# Patient Record
Sex: Male | Born: 1940 | Race: White | Hispanic: No | Smoking: Former smoker
Health system: Southern US, Community
[De-identification: ages and names within clinical notes are randomized; demographics above are authoritative.]

## PROBLEM LIST (undated history)

## (undated) DIAGNOSIS — R011 Cardiac murmur, unspecified: Secondary | ICD-10-CM

## (undated) DIAGNOSIS — K259 Gastric ulcer, unspecified as acute or chronic, without hemorrhage or perforation: Secondary | ICD-10-CM

## (undated) DIAGNOSIS — E785 Hyperlipidemia, unspecified: Secondary | ICD-10-CM

## (undated) DIAGNOSIS — M1990 Unspecified osteoarthritis, unspecified site: Secondary | ICD-10-CM

## (undated) DIAGNOSIS — I10 Essential (primary) hypertension: Secondary | ICD-10-CM

## (undated) DIAGNOSIS — A4902 Methicillin resistant Staphylococcus aureus infection, unspecified site: Secondary | ICD-10-CM

## (undated) HISTORY — PX: CARPAL TUNNEL RELEASE: SHX101

## (undated) HISTORY — PX: ROTATOR CUFF REPAIR: SHX139

## (undated) HISTORY — DX: Hyperlipidemia, unspecified: E78.5

## (undated) HISTORY — PX: GROIN DEBRIDEMENT: SHX5159

## (undated) HISTORY — DX: Methicillin resistant Staphylococcus aureus infection, unspecified site: A49.02

## (undated) HISTORY — DX: Gastric ulcer, unspecified as acute or chronic, without hemorrhage or perforation: K25.9

## (undated) HISTORY — DX: Unspecified osteoarthritis, unspecified site: M19.90

---

## 2011-09-18 ENCOUNTER — Other Ambulatory Visit: Payer: Self-pay | Admitting: Orthopedic Surgery

## 2011-09-23 ENCOUNTER — Encounter (HOSPITAL_COMMUNITY): Payer: Self-pay | Admitting: Pharmacy Technician

## 2011-10-06 ENCOUNTER — Encounter (HOSPITAL_COMMUNITY): Payer: Self-pay

## 2011-10-06 ENCOUNTER — Encounter (HOSPITAL_COMMUNITY)
Admission: RE | Admit: 2011-10-06 | Discharge: 2011-10-06 | Disposition: A | Discharge status: Home / Self Care | Payer: Medicare Other | Source: Ambulatory Visit | Attending: Orthopedic Surgery | Admitting: Orthopedic Surgery

## 2011-10-06 ENCOUNTER — Inpatient Hospital Stay (HOSPITAL_COMMUNITY): Admission: RE | Admit: 2011-10-06 | Discharge: 2011-10-06 | Payer: Medicare Other | Source: Ambulatory Visit

## 2011-10-06 ENCOUNTER — Ambulatory Visit (HOSPITAL_COMMUNITY)
Admission: RE | Admit: 2011-10-06 | Discharge: 2011-10-06 | Disposition: A | Discharge status: Home / Self Care | Payer: Medicare Other | Source: Ambulatory Visit | Attending: Orthopedic Surgery | Admitting: Orthopedic Surgery

## 2011-10-06 DIAGNOSIS — Z01818 Encounter for other preprocedural examination: Secondary | ICD-10-CM | POA: Insufficient documentation

## 2011-10-06 DIAGNOSIS — Z01812 Encounter for preprocedural laboratory examination: Secondary | ICD-10-CM | POA: Insufficient documentation

## 2011-10-06 HISTORY — DX: Cardiac murmur, unspecified: R01.1

## 2011-10-06 HISTORY — DX: Essential (primary) hypertension: I10

## 2011-10-06 LAB — COMPREHENSIVE METABOLIC PANEL
ALT: 28 U/L (ref 0–53)
AST: 33 U/L (ref 0–37)
Alkaline Phosphatase: 58 U/L (ref 39–117)
CO2: 24 mEq/L (ref 19–32)
Chloride: 97 mEq/L (ref 96–112)
GFR calc Af Amer: >90 mL/min (ref >90)
GFR calc non Af Amer: 82 mL/min — ABNORMAL LOW (ref >90)
Glucose, Bld: 87 mg/dL (ref 70–99)
Potassium: 4.3 mEq/L (ref 3.5–5.1)
Sodium: 133 mEq/L — ABNORMAL LOW (ref 135–145)

## 2011-10-06 LAB — TYPE AND SCREEN

## 2011-10-06 LAB — URINALYSIS, ROUTINE W REFLEX MICROSCOPIC
Bilirubin Urine: NEGATIVE
Hgb urine dipstick: NEGATIVE
Nitrite: NEGATIVE
Specific Gravity, Urine: 1.012 (ref 1.005–1.030)
Urobilinogen, UA: 0.2 mg/dL (ref 0.0–1.0)
pH: 6.5 (ref 5.0–8.0)

## 2011-10-06 LAB — DIFFERENTIAL
Basophils Absolute: 0 10*3/uL (ref 0.0–0.1)
Eosinophils Absolute: 0.2 10*3/uL (ref 0.0–0.7)
Eosinophils Relative: 2 % (ref 0–5)
Monocytes Absolute: 0.8 10*3/uL (ref 0.1–1.0)

## 2011-10-06 LAB — CBC
Hemoglobin: 15.3 g/dL (ref 13.0–17.0)
RBC: 4.82 MIL/uL (ref 4.22–5.81)
WBC: 9.1 10*3/uL (ref 4.0–10.5)

## 2011-10-06 LAB — APTT: aPTT: 29 seconds (ref 24–37)

## 2011-10-06 LAB — SURGICAL PCR SCREEN
MRSA, PCR: NEGATIVE
Staphylococcus aureus: NEGATIVE

## 2011-10-06 LAB — PROTIME-INR: Prothrombin Time: 13 seconds (ref 11.6–15.2)

## 2011-10-06 LAB — ABO/RH: ABO/RH(D): AB POS

## 2011-10-06 NOTE — Pre-Procedure Instructions (Signed)
20 COULTER OLDAKER  10/06/2011  Your procedure is scheduled on:  10/08/11  Report to [facility] at 8:50 AM.  Call this number if you have problems the morning of surgery: [phone]   Remember: Bring Incentive Spirometer back the day of surgery and leave in suitcase.   Do not eat food:After Midnight.  May have clear liquids: up to 4 Hours before arrival (4:50 AM).  Clear liquids include soda, tea, black coffee, apple or grape juice, broth.  Take these medicines the morning of surgery with A SIP OF WATER: None   Do not wear jewelry, make-up or nail polish.  Do not wear lotions, powders, or perfumes. You may wear deodorant.  Do not shave 48 hours prior to surgery. Men may shave face and neck.  Do not bring valuables to the hospital.  Contacts, dentures or bridgework may not be worn into surgery.  Leave suitcase in the car. After surgery it may be brought to your room.  For patients admitted to the hospital, checkout time is 11:00 AM the day of discharge.   Patients discharged the day of surgery will not be allowed to drive home.    Special Instructions: CHG Shower Use Special Wash: 1/2 bottle night before surgery and 1/2 bottle morning of surgery.   Please read over the following fact sheets that you were given: Pain Booklet, Coughing and Deep Breathing, Blood Transfusion Information, MRSA Information and Surgical Site Infection Prevention

## 2011-10-06 NOTE — Progress Notes (Signed)
Gave note to [facility] to request EKG from Dr. Wende Mott office patient states was done 2 wks ago. Also requesting most recent office note, tests, EKG from [facility]. Patient is having ECHO done 5/20 @ 2:00.

## 2011-10-06 NOTE — Pre-Procedure Instructions (Signed)
20 Alan Munoz  10/06/2011   Your procedure is scheduled on:  10/08/11  Report to [facility] at 8:50  AM.  Call this number if you have problems the morning of surgery: [phone]   Remember: Bring Incentive Spirometer back in suitcase day of surgery.   Do not eat food:After Midnight.  May have clear liquids: up to 4 Hours before arrival (4:50 AM).  Clear liquids include soda, tea, black coffee, apple or grape juice, broth.  Take these medicines the morning of surgery with A SIP OF WATER: None   Do not wear jewelry, make-up or nail polish.  Do not wear lotions, powders, or perfumes. You may wear deodorant.  Do not shave 48 hours prior to surgery. Men may shave face and neck.  Do not bring valuables to the hospital.  Contacts, dentures or bridgework may not be worn into surgery.  Leave suitcase in the car. After surgery it may be brought to your room.  For patients admitted to the hospital, checkout time is 11:00 AM the day of discharge.   Patients discharged the day of surgery will not be allowed to drive home.    Special Instructions: CHG Shower Use Special Wash: 1/2 bottle night before surgery and 1/2 bottle morning of surgery.   Please read over the following fact sheets that you were given: Pain Booklet, Coughing and Deep Breathing, Blood Transfusion Information, MRSA Information and Surgical Site Infection Prevention

## 2011-10-07 MED ORDER — CEFAZOLIN SODIUM-DEXTROSE 2-3 GM-% IV SOLR
2.0000 g | INTRAVENOUS | Status: AC
  Administered 2011-10-08: 1 g via INTRAVENOUS
  Administered 2011-10-08: 2 g via INTRAVENOUS
  Filled 2011-10-07: qty 50

## 2011-10-07 NOTE — Consult Note (Signed)
Anesthesia Chart Review:  Patient is a 71 year old male scheduled for ACDF C4-7 on 10/08/11.  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  He receives primary care at [facility].  He was recently evaluated by Cardiologist Dr. Wille Glaser at [facility] on 10/02/11 for abnormal holter monitor and pre-operative evaluation.  Holter on 09/23/11 showed occasional PVCs, frequent PACs and no significant bradycardia or pauses.  EKG from [facility] on 09/19/11 showed SR, first degree AVB, LAD, incomplete right BBB, LAFB.     Echo was done on 10/06/11 at [facility] and showed mild LVH, normal LV systolic function with no appreciable segmental abnormalities, mild to moderate AR, mild TR/PR.  Ultimately he was felt low cardiac risk for the planned cervical procedure, although he is at some risk for post-operative afib.  Repeat echo in 2-3 years was recommended either by Cardiology or his PCP.  Labs noted.  CXR from 10/06/11 showed no acute cardiopulmonary process.   Plan to proceed.  Shonna Chock, PA-C

## 2011-10-07 NOTE — Progress Notes (Signed)
Chart to Shonna Chock PA to review EKG, cardiac notes

## 2011-10-07 NOTE — Progress Notes (Signed)
[facility](#[phone], [phone]) called to request for recent EKG, ECHO, and recent Office Visit notes. Requested to fax w/ letterhead.  Faxed.//L. Ellenora Talton,RN

## 2011-10-08 ENCOUNTER — Inpatient Hospital Stay (HOSPITAL_COMMUNITY)
Admission: RE | Admit: 2011-10-08 | Discharge: 2011-10-09 | DRG: 473 | Disposition: A | Discharge status: Home / Self Care | Payer: Medicare Other | Source: Ambulatory Visit | Attending: Orthopedic Surgery | Admitting: Orthopedic Surgery

## 2011-10-08 ENCOUNTER — Inpatient Hospital Stay (HOSPITAL_COMMUNITY): Payer: Medicare Other

## 2011-10-08 ENCOUNTER — Inpatient Hospital Stay (HOSPITAL_COMMUNITY): Payer: Medicare Other | Admitting: Vascular Surgery

## 2011-10-08 ENCOUNTER — Encounter (HOSPITAL_COMMUNITY): Payer: Self-pay | Admitting: Vascular Surgery

## 2011-10-08 ENCOUNTER — Encounter (HOSPITAL_COMMUNITY)
Admission: RE | Disposition: A | Discharge status: Home / Self Care | Payer: Self-pay | Source: Ambulatory Visit | Attending: Orthopedic Surgery

## 2011-10-08 ENCOUNTER — Encounter (HOSPITAL_COMMUNITY): Payer: Self-pay | Admitting: Surgery

## 2011-10-08 DIAGNOSIS — Z87891 Personal history of nicotine dependence: Secondary | ICD-10-CM

## 2011-10-08 DIAGNOSIS — Z888 Allergy status to other drugs, medicaments and biological substances status: Secondary | ICD-10-CM

## 2011-10-08 DIAGNOSIS — Z8614 Personal history of Methicillin resistant Staphylococcus aureus infection: Secondary | ICD-10-CM

## 2011-10-08 DIAGNOSIS — I10 Essential (primary) hypertension: Secondary | ICD-10-CM | POA: Diagnosis present

## 2011-10-08 DIAGNOSIS — M5000 Cervical disc disorder with myelopathy, unspecified cervical region: Principal | ICD-10-CM | POA: Diagnosis present

## 2011-10-08 HISTORY — PX: ANTERIOR CERVICAL DECOMP/DISCECTOMY FUSION: SHX1161

## 2011-10-08 SURGERY — ANTERIOR CERVICAL DECOMPRESSION/DISCECTOMY FUSION 3 LEVELS
Anesthesia: General | Site: Spine Cervical | Laterality: Bilateral | Wound class: Clean

## 2011-10-08 MED ORDER — ROCURONIUM BROMIDE 100 MG/10ML IV SOLN
INTRAVENOUS | Status: DC | PRN
  Administered 2011-10-08: 50 mg via INTRAVENOUS

## 2011-10-08 MED ORDER — PROPOFOL 10 MG/ML IV EMUL
INTRAVENOUS | Status: DC | PRN
  Administered 2011-10-08: 200 mg via INTRAVENOUS

## 2011-10-08 MED ORDER — VITAMIN E 180 MG (400 UNIT) PO CAPS
400.0000 [IU] | ORAL_CAPSULE | Freq: Two times a day (BID) | ORAL | Status: DC
  Administered 2011-10-08 – 2011-10-09 (×2): 400 [IU] via ORAL
  Filled 2011-10-08 (×3): qty 1

## 2011-10-08 MED ORDER — DOCUSATE SODIUM 100 MG PO CAPS
100.0000 mg | ORAL_CAPSULE | Freq: Two times a day (BID) | ORAL | Status: DC
  Administered 2011-10-08 – 2011-10-09 (×2): 100 mg via ORAL
  Filled 2011-10-08 (×2): qty 1

## 2011-10-08 MED ORDER — LACTATED RINGERS IV SOLN
INTRAVENOUS | Status: DC
  Administered 2011-10-08: 50 mL/h via INTRAVENOUS

## 2011-10-08 MED ORDER — PHENOL 1.4 % MT LIQD
1.0000 | OROMUCOSAL | Status: DC | PRN
  Filled 2011-10-08: qty 177

## 2011-10-08 MED ORDER — OXYCODONE-ACETAMINOPHEN 5-325 MG PO TABS
1.0000 | ORAL_TABLET | ORAL | Status: DC | PRN
  Administered 2011-10-08 – 2011-10-09 (×4): 2 via ORAL
  Filled 2011-10-08 (×4): qty 2

## 2011-10-08 MED ORDER — ALUM & MAG HYDROXIDE-SIMETH 200-200-20 MG/5ML PO SUSP: 30.0000 mL | Freq: Four times a day (QID) | ORAL | Status: DC | PRN

## 2011-10-08 MED ORDER — POVIDONE-IODINE 7.5 % EX SOLN
Freq: Once | CUTANEOUS | Status: DC
  Filled 2011-10-08: qty 118

## 2011-10-08 MED ORDER — ACETAMINOPHEN 650 MG RE SUPP: 650.0000 mg | RECTAL | Status: DC | PRN

## 2011-10-08 MED ORDER — HYDROCHLOROTHIAZIDE 25 MG PO TABS
25.0000 mg | ORAL_TABLET | Freq: Every day | ORAL | Status: DC
  Administered 2011-10-09: 25 mg via ORAL
  Filled 2011-10-08 (×2): qty 1

## 2011-10-08 MED ORDER — POTASSIUM CHLORIDE IN NACL 20-0.9 MEQ/L-% IV SOLN
INTRAVENOUS | Status: DC
  Administered 2011-10-08: 18:00:00 via INTRAVENOUS
  Filled 2011-10-08 (×4): qty 1000

## 2011-10-08 MED ORDER — HYDROMORPHONE HCL PF 1 MG/ML IJ SOLN
0.2500 mg | INTRAMUSCULAR | Status: DC | PRN
  Administered 2011-10-08 (×3): 0.5 mg via INTRAVENOUS

## 2011-10-08 MED ORDER — SIMVASTATIN 20 MG PO TABS
20.0000 mg | ORAL_TABLET | Freq: Every day | ORAL | Status: DC
  Filled 2011-10-08 (×2): qty 1

## 2011-10-08 MED ORDER — BUPIVACAINE-EPINEPHRINE 0.25% -1:200000 IJ SOLN
INTRAMUSCULAR | Status: DC | PRN
  Administered 2011-10-08: 2 mL

## 2011-10-08 MED ORDER — EPHEDRINE SULFATE 50 MG/ML IJ SOLN
INTRAMUSCULAR | Status: DC | PRN
  Administered 2011-10-08: 10 mg via INTRAVENOUS

## 2011-10-08 MED ORDER — ZOLPIDEM TARTRATE 5 MG PO TABS: 5.0000 mg | ORAL_TABLET | Freq: Every evening | ORAL | Status: DC | PRN

## 2011-10-08 MED ORDER — MENTHOL 3 MG MT LOZG
1.0000 | LOZENGE | OROMUCOSAL | Status: DC | PRN
  Administered 2011-10-09: 3 mg via ORAL
  Filled 2011-10-08 (×2): qty 9

## 2011-10-08 MED ORDER — MORPHINE SULFATE 2 MG/ML IJ SOLN
2.0000 mg | INTRAMUSCULAR | Status: DC | PRN
  Administered 2011-10-09: 2 mg via INTRAVENOUS
  Filled 2011-10-08: qty 1

## 2011-10-08 MED ORDER — SODIUM CHLORIDE 0.9 % IV SOLN
INTRAVENOUS | Status: DC | PRN
  Administered 2011-10-08: 16:00:00 via INTRAVENOUS

## 2011-10-08 MED ORDER — LIDOCAINE HCL (CARDIAC) 20 MG/ML IV SOLN
INTRAVENOUS | Status: DC | PRN
  Administered 2011-10-08: 80 mg via INTRAVENOUS

## 2011-10-08 MED ORDER — NIACIN ER 500 MG PO TBCR
500.0000 mg | EXTENDED_RELEASE_TABLET | Freq: Every day | ORAL | Status: DC
  Administered 2011-10-08: 500 mg via ORAL
  Filled 2011-10-08 (×2): qty 1

## 2011-10-08 MED ORDER — LOSARTAN POTASSIUM-HCTZ 100-25 MG PO TABS: 1.0000 | ORAL_TABLET | Freq: Every day | ORAL | Status: DC

## 2011-10-08 MED ORDER — CEFAZOLIN SODIUM 1-5 GM-% IV SOLN
INTRAVENOUS | Status: AC
  Filled 2011-10-08: qty 50

## 2011-10-08 MED ORDER — CALCIUM CARBONATE 600 MG PO TABS: 600.0000 mg | ORAL_TABLET | Freq: Two times a day (BID) | ORAL | Status: DC

## 2011-10-08 MED ORDER — VITAMIN B-6 100 MG PO TABS
100.0000 mg | ORAL_TABLET | Freq: Two times a day (BID) | ORAL | Status: DC
  Administered 2011-10-08 – 2011-10-09 (×2): 100 mg via ORAL
  Filled 2011-10-08 (×3): qty 1

## 2011-10-08 MED ORDER — VITAMIN D3 25 MCG (1000 UNIT) PO TABS
1000.0000 [IU] | ORAL_TABLET | Freq: Every day | ORAL | Status: DC
  Administered 2011-10-09: 1000 [IU] via ORAL
  Filled 2011-10-08: qty 1

## 2011-10-08 MED ORDER — FOLIC ACID 1 MG PO TABS
1.0000 mg | ORAL_TABLET | Freq: Every day | ORAL | Status: DC
  Administered 2011-10-09: 1 mg via ORAL
  Filled 2011-10-08: qty 1

## 2011-10-08 MED ORDER — SODIUM CHLORIDE 0.9 % IJ SOLN: 3.0000 mL | INTRAMUSCULAR | Status: DC | PRN

## 2011-10-08 MED ORDER — CO Q 10 10 MG PO CAPS: 1.0000 | ORAL_CAPSULE | Freq: Every day | ORAL | Status: DC

## 2011-10-08 MED ORDER — FENTANYL CITRATE 0.05 MG/ML IJ SOLN
INTRAMUSCULAR | Status: DC | PRN
  Administered 2011-10-08 (×5): 50 ug via INTRAVENOUS
  Administered 2011-10-08: 100 ug via INTRAVENOUS
  Administered 2011-10-08 (×3): 50 ug via INTRAVENOUS

## 2011-10-08 MED ORDER — ONDANSETRON HCL 4 MG/2ML IJ SOLN: 4.0000 mg | Freq: Four times a day (QID) | INTRAMUSCULAR | Status: DC | PRN

## 2011-10-08 MED ORDER — ADULT MULTIVITAMIN W/MINERALS CH
1.0000 | ORAL_TABLET | Freq: Every day | ORAL | Status: DC
  Administered 2011-10-09: 1 via ORAL
  Filled 2011-10-08: qty 1

## 2011-10-08 MED ORDER — VITAMIN C 500 MG PO TABS
1000.0000 mg | ORAL_TABLET | Freq: Every day | ORAL | Status: DC
  Administered 2011-10-09: 1000 mg via ORAL
  Filled 2011-10-08: qty 2

## 2011-10-08 MED ORDER — INOSITOL NIACINATE 500 MG PO CAPS: 1.0000 | ORAL_CAPSULE | Freq: Every day | ORAL | Status: DC

## 2011-10-08 MED ORDER — 0.9 % SODIUM CHLORIDE (POUR BTL) OPTIME
TOPICAL | Status: DC | PRN
  Administered 2011-10-08: 1000 mL

## 2011-10-08 MED ORDER — SENNA 8.6 MG PO TABS
1.0000 | ORAL_TABLET | Freq: Two times a day (BID) | ORAL | Status: DC
  Administered 2011-10-09: 8.6 mg via ORAL
  Filled 2011-10-08 (×2): qty 1

## 2011-10-08 MED ORDER — ACETAMINOPHEN 325 MG PO TABS: 650.0000 mg | ORAL_TABLET | ORAL | Status: DC | PRN

## 2011-10-08 MED ORDER — DIAZEPAM 5 MG PO TABS
5.0000 mg | ORAL_TABLET | Freq: Four times a day (QID) | ORAL | Status: DC | PRN
  Administered 2011-10-08 – 2011-10-09 (×3): 5 mg via ORAL
  Filled 2011-10-08 (×3): qty 1

## 2011-10-08 MED ORDER — SODIUM CHLORIDE 0.9 % IJ SOLN
3.0000 mL | Freq: Two times a day (BID) | INTRAMUSCULAR | Status: DC
  Administered 2011-10-08: 3 mL via INTRAVENOUS

## 2011-10-08 MED ORDER — THROMBIN 20000 UNITS EX KIT
PACK | CUTANEOUS | Status: DC | PRN
  Administered 2011-10-08: 12:00:00 via TOPICAL

## 2011-10-08 MED ORDER — LOSARTAN POTASSIUM 50 MG PO TABS
100.0000 mg | ORAL_TABLET | Freq: Every day | ORAL | Status: DC
  Administered 2011-10-08 – 2011-10-09 (×2): 100 mg via ORAL
  Filled 2011-10-08 (×2): qty 2

## 2011-10-08 MED ORDER — FOLIC ACID 800 MCG PO TABS: 400.0000 ug | ORAL_TABLET | Freq: Two times a day (BID) | ORAL | Status: DC

## 2011-10-08 MED ORDER — ONDANSETRON HCL 4 MG/2ML IJ SOLN: 4.0000 mg | INTRAMUSCULAR | Status: DC | PRN

## 2011-10-08 MED ORDER — CALCIUM CARBONATE 1250 (500 CA) MG PO TABS
500.0000 mg | ORAL_TABLET | Freq: Two times a day (BID) | ORAL | Status: DC
  Administered 2011-10-09: 500 mg via ORAL
  Filled 2011-10-08 (×3): qty 1

## 2011-10-08 MED ORDER — CEFAZOLIN SODIUM 1-5 GM-% IV SOLN
1.0000 g | Freq: Three times a day (TID) | INTRAVENOUS | Status: AC
  Administered 2011-10-08 – 2011-10-09 (×2): 1 g via INTRAVENOUS
  Filled 2011-10-08 (×2): qty 50

## 2011-10-08 MED ORDER — HYDROMORPHONE HCL PF 1 MG/ML IJ SOLN
INTRAMUSCULAR | Status: AC
  Filled 2011-10-08: qty 1

## 2011-10-08 MED ORDER — MIDAZOLAM HCL 5 MG/5ML IJ SOLN
INTRAMUSCULAR | Status: DC | PRN
  Administered 2011-10-08: 2 mg via INTRAVENOUS

## 2011-10-08 MED ORDER — LACTATED RINGERS IV SOLN
INTRAVENOUS | Status: DC | PRN
  Administered 2011-10-08 (×3): via INTRAVENOUS

## 2011-10-08 SURGICAL SUPPLY — 80 items
BENZOIN TINCTURE PRP APPL 2/3 (GAUZE/BANDAGES/DRESSINGS) ×2 IMPLANT
BIT DRILL NEURO 2X3.1 SFT TUCH (MISCELLANEOUS) ×1 IMPLANT
BLADE LONG MED 31X9 (MISCELLANEOUS) IMPLANT
BLADE SURG 15 STRL LF DISP TIS (BLADE) ×1 IMPLANT
BLADE SURG 15 STRL SS (BLADE) ×1
BLADE SURG ROTATE 9660 (MISCELLANEOUS) IMPLANT
BUR MATCHSTICK NEURO 3.0 LAGG (BURR) ×2 IMPLANT
CARTRIDGE OIL MAESTRO DRILL (MISCELLANEOUS) ×1 IMPLANT
CLOTH BEACON ORANGE TIMEOUT ST (SAFETY) ×2 IMPLANT
COLLAR CERV LO CONTOUR FIRM DE (SOFTGOODS) IMPLANT
CORDS BIPOLAR (ELECTRODE) ×2 IMPLANT
COVER SURGICAL LIGHT HANDLE (MISCELLANEOUS) ×2 IMPLANT
CRADLE DONUT ADULT HEAD (MISCELLANEOUS) ×2 IMPLANT
DEVICE ENDSKLTN IMPLNT MED 6MM (Orthopedic Implant) ×1 IMPLANT
DEVICE ENDSKLTN MED 6 7MM (Orthopedic Implant) ×1 IMPLANT
DIFFUSER DRILL AIR PNEUMATIC (MISCELLANEOUS) ×2 IMPLANT
DRAIN JACKSON RD 7FR 3/32 (WOUND CARE) IMPLANT
DRAPE C-ARM 42X72 X-RAY (DRAPES) ×2 IMPLANT
DRAPE POUCH INSTRU U-SHP 10X18 (DRAPES) ×2 IMPLANT
DRAPE SURG 17X23 STRL (DRAPES) ×6 IMPLANT
DRILL NEURO 2X3.1 SOFT TOUCH (MISCELLANEOUS) ×2
DURAPREP 26ML APPLICATOR (WOUND CARE) ×2 IMPLANT
ELECT COATED BLADE 2.86 ST (ELECTRODE) ×2 IMPLANT
ELECT REM PT RETURN 9FT ADLT (ELECTROSURGICAL) ×2
ELECTRODE REM PT RTRN 9FT ADLT (ELECTROSURGICAL) ×1 IMPLANT
ENDOSKELETON IMPLANT MED 6MM (Orthopedic Implant) ×2 IMPLANT
ENDOSKELETON MED 6 7MM (Orthopedic Implant) ×2 IMPLANT
EVACUATOR SILICONE 100CC (DRAIN) IMPLANT
GAUZE SPONGE 4X4 16PLY XRAY LF (GAUZE/BANDAGES/DRESSINGS) ×2 IMPLANT
GLOVE BIO SURGEON STRL SZ8 (GLOVE) ×4 IMPLANT
GLOVE BIOGEL M 7.0 STRL (GLOVE) ×2 IMPLANT
GLOVE BIOGEL PI IND STRL 7.0 (GLOVE) ×1 IMPLANT
GLOVE BIOGEL PI IND STRL 7.5 (GLOVE) ×2 IMPLANT
GLOVE BIOGEL PI IND STRL 8 (GLOVE) ×1 IMPLANT
GLOVE BIOGEL PI INDICATOR 7.0 (GLOVE) ×1
GLOVE BIOGEL PI INDICATOR 7.5 (GLOVE) ×2
GLOVE BIOGEL PI INDICATOR 8 (GLOVE) ×1
GLOVE SURG SS PI 6.5 STRL IVOR (GLOVE) ×2 IMPLANT
GLOVE SURG SS PI 7.5 STRL IVOR (GLOVE) ×2 IMPLANT
GOWN STRL NON-REIN LRG LVL3 (GOWN DISPOSABLE) IMPLANT
GOWN STRL REIN XL XLG (GOWN DISPOSABLE) ×4 IMPLANT
INTERLOCK LRDTC CRVCL VBR 8MM (Peek) ×1 IMPLANT
IV CATH 14GX2 1/4 (CATHETERS) ×2 IMPLANT
KIT BASIN OR (CUSTOM PROCEDURE TRAY) ×2 IMPLANT
KIT ROOM TURNOVER OR (KITS) ×2 IMPLANT
LORDOTIC CERVICAL VBR 8MM SM (Peek) ×2 IMPLANT
MANIFOLD NEPTUNE II (INSTRUMENTS) ×2 IMPLANT
MARKER SKIN DUAL TIP RULER LAB (MISCELLANEOUS) ×2 IMPLANT
NEEDLE 27GAX1X1/2 (NEEDLE) ×2 IMPLANT
NEEDLE SPNL 20GX3.5 QUINCKE YW (NEEDLE) ×2 IMPLANT
NS IRRIG 1000ML POUR BTL (IV SOLUTION) ×2 IMPLANT
OIL CARTRIDGE MAESTRO DRILL (MISCELLANEOUS) ×2
PACK ORTHO CERVICAL (CUSTOM PROCEDURE TRAY) ×2 IMPLANT
PAD ARMBOARD 7.5X6 YLW CONV (MISCELLANEOUS) ×4 IMPLANT
PATTIES SURGICAL .5 X.5 (GAUZE/BANDAGES/DRESSINGS) IMPLANT
PATTIES SURGICAL .5 X1 (DISPOSABLE) ×2 IMPLANT
PIN DISTRACTION 14 (PIN) ×4 IMPLANT
PIN DISTRACTION 14MM (PIN) IMPLANT
PLATE 51 BND GRV PREBNT 60X16 (Plate) ×1 IMPLANT
PLATE 51MM (Plate) ×1 IMPLANT
PUTTY BONE DBX 5CC MIX (Putty) ×2 IMPLANT
SCREW 4.0X16MM (Screw) ×16 IMPLANT
SPONGE GAUZE 4X4 12PLY (GAUZE/BANDAGES/DRESSINGS) ×2 IMPLANT
SPONGE INTESTINAL PEANUT (DISPOSABLE) ×2 IMPLANT
SPONGE SURGIFOAM ABS GEL 100 (HEMOSTASIS) ×2 IMPLANT
STRIP CLOSURE SKIN 1/2X4 (GAUZE/BANDAGES/DRESSINGS) ×2 IMPLANT
SURGIFLO TRUKIT (HEMOSTASIS) IMPLANT
SUT MNCRL AB 4-0 PS2 18 (SUTURE) ×2 IMPLANT
SUT SILK 4 0 (SUTURE) ×1
SUT SILK 4-0 18XBRD TIE 12 (SUTURE) ×1 IMPLANT
SUT VIC AB 2-0 CT2 18 VCP726D (SUTURE) ×2 IMPLANT
SYR BULB IRRIGATION 50ML (SYRINGE) ×2 IMPLANT
SYR CONTROL 10ML LL (SYRINGE) ×2 IMPLANT
TAPE CLOTH 4X10 WHT NS (GAUZE/BANDAGES/DRESSINGS) IMPLANT
TAPE UMBILICAL COTTON 1/8X30 (MISCELLANEOUS) ×4 IMPLANT
TOWEL OR 17X24 6PK STRL BLUE (TOWEL DISPOSABLE) ×2 IMPLANT
TOWEL OR 17X26 10 PK STRL BLUE (TOWEL DISPOSABLE) ×2 IMPLANT
TRAY FOLEY CATH 14FR (SET/KITS/TRAYS/PACK) ×2 IMPLANT
WATER STERILE IRR 1000ML POUR (IV SOLUTION) ×2 IMPLANT
YANKAUER SUCT BULB TIP NO VENT (SUCTIONS) ×2 IMPLANT

## 2011-10-08 NOTE — Op Note (Signed)
NAMEJHAIR, Alan Munoz                ACCOUNT NO.:  [id number]  MEDICAL RECORD NO.:  [id number]  LOCATION:  3528                         FACILITY:  [facility]  PHYSICIAN:  Estill Bamberg, MD      DATE OF BIRTH:  1940/07/16  DATE OF PROCEDURE:  10/08/2011 DATE OF DISCHARGE:                              OPERATIVE REPORT   PREOPERATIVE DIAGNOSIS:  Cervical myeloradiculopathy.  POSTOPERATIVE DIAGNOSIS:  Cervical myeloradiculopathy.  PROCEDURE: 1. C4-5, C5-6, C6-7 anterior cervical decompression and fusion. 2. Placement of anterior instrumentation, C4-C7. 3. Insertion of interbody device x3 (Titian medium interbody cages). 4. Use of local autograft. 5. Use of morselized allograft. 6. Intraoperative use of fluoroscopy.  SURGEON:  Estill Bamberg, MD.  ASSISTANT:  Skip Mayer, PA-C.  ANESTHESIA:  General endotracheal anesthesia.  COMPLICATIONS:  None.  DISPOSITION:  Stable.  ESTIMATED BLOOD LOSS:  Minimal.  INDICATIONS FOR PROCEDURE:  Briefly, Alan Munoz is a very pleasant 71- year-old male who initially presented to me with numbness in his bilateral hands as well as tingling in his hands.  Of note, the patient did see me for a second opinion.  He did previously see a Careers adviser who did recommend a three-level anterior cervical decompression and fusion. Upon my evaluation with the patient, he did report deterioration in his balance and deterioration in his fine motor skills.  He did also have numbness in his bilateral hands.  He did have a positive Hoffman sign bilaterally.  I did review an MRI, which was notable for prominent compression of the spinal cord at the C4-5 level and  neural foraminal stenosis at C5-6 and C6-7.  Given the spinal cord compression and patient's ongoing symptoms and exam findings associated with myelopathy, we did have a discussion regarding going forward with a three-level anterior cervical decompression and fusion.  The patient did fully understand the risks  and limitations of the procedure as outlined in my preoperative note.  OPERATIVE COURSE:  On Oct 08, 2011, the patient was brought to surgery and general endotracheal anesthesia was administered.  The patient was placed supine on a well-padded hospital bed.  The neck was placed in a gentle degree of extension.  The patient's arms were secured to the sides.  All bony prominences were meticulously padded.  The area of the ulnar nerve was meticulously padded.  SCDs were placed and antibiotics were given.  I then prepped and draped the neck in the usual sterile fashion.  I then made a transverse incision from the midline to the medial border of the sternocleidomastoid muscle overlying the C5-6 interspace.  The platysma was sharply incised.  The plane between the sternocleidomastoid muscle laterally and strap muscles medially was readily identified and explored.  The carotid artery was noted and was retracted laterally.  The anterior cervical spine was readily identified.  I did place a spinal needle into the C4-5 interspace and a lateral fluoroscopic view did confirm this to be the appropriate operative level.  Of note, there were very prominent osteophytes noted at the C5-6 and C6-7 interspaces and these were taken down using a rongeur and placed on the back table.  There was also noted to be anterolisthesis across  the C4-5 interspace.  This was noted in the patient's preoperative radiographs.  I then turned my attention towards the C6-7 interspace.  I then placed a Caspar pin into the C6 and C7 vertebral bodies and gentle distraction was applied across the C6-7 interspace.  Of note, this interspace was extremely tight and very much difficult to access.  I did however meticulously use a series of curettes and Kerrison punches to gain access into the C6-7 intervertebral space.  I did gain access to the posterior aspect of the intervertebral space and a thorough neural foraminal decompression  was performed on both the right and left sides using both the Kerrison and a 2-mm high-speed bur.  I then prepared the endplates using a series of rasps and I did feel that the 6-mm implant would be the most appropriate fit.  A medium Titan implant was packed with allograft in the form of the DBX mix in addition to autograft obtained from removing the osteophytes.  This was tamped into position in the usual fashion and I did note an excellent press-fit.  I then removed the C7 Caspar pin and bone wax was placed in its place.  I then turned my attention towards the C5-6 interspace.  Again, Caspar pin was applied, was advanced into the C5 vertebral body and distraction was applied.  There also were very prominent osteophytes noted, which were removed.  I did ultimately gain access to the posterior longitudinal ligament and again, a thorough neural foraminal decompression was performed on both the right and the left sides.  I then prepared the endplates and filled a 7-mm medium lordotic interbody Titan graft with allograft and autograft, and this again was tamped into position in the usual fashion.  I then turned my attention towards the C4-5 interspace.  A diskectomy was then performed in the manner as described previously, Caspar pins were placed.  This was the level of the most significant compression of the spinal cord and I did therefore take my time and performed a very meticulous and delicate decompression.  I did gain access to the posterior longitudinal ligament and the posterior longitudinal ligament was taken down across the interspace and out to the right and left neural foramina.  I was easily able to pass a nerve hook out both the right and left neural foramina.  At this point, I again prepare the endplates and a 8-mm small lordotic implant was tamped into position.  I did note an excellent press-fit.  Of note, an anterolisthesis was identified preoperatively and this did remain  postoperatively.  I did therefore liberally use lateral fluoroscopy to confirm that the small 8 mm implant was of appropriate fit, which it was.  I then evaluated each of the interbody implants and I was very pleased with the final press-fit of each of the implants.  I then chose an appropriately sized Synthes Vectra Plate. This was placed over the anterior cervical spine.  I then placed a 16-mm self-drilling, self-tapping screws, 2 in each vertebral body for total of the 8 screws.  I was happy with the final press-fit of each of the screws.  At this point, I thoroughly irrigated the wound.  I did explore the wound for any undue bleeding and there was no undue bleeding encountered.  The platysma was then closed using 2-0 Vicryl.  The skin was closed using 4-0 Monocryl.  All instrument counts were correct at the termination of the procedure.  I again obtained AP and lateral intraoperative  fluoroscopic views and I was extremely pleased with the final appearance.  Of note, Skip Mayer was my assistant throughout the procedure and aided in essential retraction and suctioning required throughout the surgery.     Estill Bamberg, MD     MD/MEDQ  D:  10/08/2011  T:  10/08/2011  Job:  [id number]  cc:   Daneil Dolin, MD

## 2011-10-08 NOTE — H&P (Signed)
PREOPERATIVE H&P  Chief Complaint: Balance instability and myelopathy  HPI: Alan Munoz is a 71 y.o. male who presents with cervical myelopathy  Past Medical History  Diagnosis Date  . Hypertension   . Heart murmur    Past Surgical History  Procedure Date  . Rotator cuff repair     2008 right  . Carpal tunnel release     12/2010 bilateral  . 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 Marital Status: Widowed    Spouse Name: N/A    Number of Children: N/A  . Years of Education: N/A   Social History Main Topics  . Smoking status: Former Smoker    Types: Pipe  . Smokeless tobacco: Never Used  . Alcohol Use:   . Drug Use:   . Sexually Active:    Other Topics Concern  . Not on file   Social History Narrative  . No narrative on file   No family history on file. Allergies  Allergen Reactions  . Codeine Nausea And Vomiting   Prior to Admission medications   Medication Sig Start Date End Date Taking? Authorizing Provider  Ascorbic Acid (VITAMIN C) 1000 MG tablet Take 1,000 mg by mouth daily.   Yes Historical Provider, MD  aspirin 81 MG chewable tablet Chew 81 mg by mouth daily.   Yes Historical Provider, MD  calcium carbonate (OS-CAL) 600 MG TABS Take 600 mg by mouth 2 (two) times daily with a meal.   Yes Historical Provider, MD  cholecalciferol (VITAMIN D) 1000 UNITS tablet Take 1,000 Units by mouth daily.   Yes Historical Provider, MD  Coenzyme Q10 (CO Q 10) 10 MG CAPS Take 1 tablet by mouth daily.   Yes Historical Provider, MD  Flax Oil-Fish Oil-Borage Oil (FISH OIL-FLAX OIL-BORAGE OIL) CAPS Take 1 capsule by mouth 2 (two) times daily.   Yes Historical Provider, MD  folic acid (FOLVITE) 800 MCG tablet Take 400 mcg by mouth 2 (two) times daily.   Yes Historical Provider, MD  Inositol Niacinate (NIACIN FLUSH FREE) 500 MG CAPS Take 1 capsule by mouth daily.   Yes Historical Provider, MD  IRON PO Take 1 tablet by mouth daily.    Yes Historical Provider, MD  losartan-hydrochlorothiazide (HYZAAR) 100-25 MG per tablet Take 1 tablet by mouth daily.   Yes Historical Provider, MD  Magnesium 250 MG TABS Take by mouth.   Yes Historical Provider, MD  Multiple Vitamin (MULITIVITAMIN WITH MINERALS) TABS Take 1 tablet by mouth daily.   Yes Historical Provider, MD  Nutritional Supplements (CHOICE DM FIBER-BURST PO) Take 1 tablet by mouth daily.   Yes Historical Provider, MD  pravastatin (PRAVACHOL) 40 MG tablet Take 40 mg by mouth daily.   Yes Historical Provider, MD  pyridOXINE (VITAMIN B-6) 100 MG tablet Take 100 mg by mouth 2 (two) times daily.   Yes Historical Provider, MD  Red Yeast Rice 600 MG CAPS Take 1 capsule by mouth 2 (two) times daily.   Yes Historical Provider, MD  vitamin E 400 UNIT capsule Take 400 Units by mouth 2 (two) times daily.   Yes Historical Provider, MD     All other systems have been reviewed and were otherwise negative with the exception of those mentioned in the HPI and as above.  Physical Exam: Filed Vitals:   10/06/11 1133  BP: 146/79  Pulse: 76  Temp: 97.9 F (36.6 C)  Resp: 20  General: Alert, no acute distress Cardiovascular: No pedal edema Respiratory: No cyanosis, no use of accessory musculature GI: No organomegaly, abdomen is soft and non-tender Skin: No lesions in the area of chief complaint Neurologic: Sensation intact distally Psychiatric: Patient is competent for consent with normal mood and affect Lymphatic: No axillary or cervical lymphadenopathy  MUSCULOSKELETAL: + hoffman's b/l  Assessment/Plan: Myelopathy Plan for Procedure(s): ANTERIOR CERVICAL DECOMPRESSION/DISCECTOMY FUSION 3 LEVELS   Emilee Hero, MD 10/08/2011 8:07 AM

## 2011-10-08 NOTE — Anesthesia Procedure Notes (Signed)
Procedure Name: Intubation Date/Time: 10/08/2011 11:07 AM Performed by: Rossie Muskrat L Pre-anesthesia Checklist: Patient identified, Timeout performed, Emergency Drugs available, Suction available and Patient being monitored Patient Re-evaluated:Patient Re-evaluated prior to inductionOxygen Delivery Method: Circle system utilized Preoxygenation: Pre-oxygenation with 100% oxygen Intubation Type: IV induction Ventilation: Mask ventilation without difficulty Laryngoscope Size: Miller and 2 Grade View: Grade I Tube type: Oral Tube size: 7.5 mm Number of attempts: 1 Airway Equipment and Method: Stylet Placement Confirmation: ETT inserted through vocal cords under direct vision,  breath sounds checked- equal and bilateral and positive ETCO2 Secured at: 21 cm Tube secured with: Tape Dental Injury: Teeth and Oropharynx as per pre-operative assessment

## 2011-10-08 NOTE — Preoperative (Signed)
Beta Blockers   Reason not to administer Beta Blockers:Not Applicable 

## 2011-10-08 NOTE — Addendum Note (Signed)
Addendum  created 10/08/11 2359 by Tin Engram A Gaynor Genco, CRNA   Modules edited:Charges VN    

## 2011-10-08 NOTE — Addendum Note (Signed)
Addendum  created 10/08/11 2359 by Atilano Ina, CRNA   Modules edited:Charges VN

## 2011-10-08 NOTE — Anesthesia Preprocedure Evaluation (Signed)
Anesthesia Evaluation  Patient identified by MRN, date of birth, ID band Patient awake    Reviewed: Allergy & Precautions, H&P , NPO status , Patient's Chart, lab work & pertinent test results  Airway Mallampati: II  Neck ROM: full    Dental   Pulmonary former smoker         Cardiovascular hypertension,     Neuro/Psych    GI/Hepatic   Endo/Other    Renal/GU      Musculoskeletal   Abdominal   Peds  Hematology   Anesthesia Other Findings   Reproductive/Obstetrics                           Anesthesia Physical Anesthesia Plan  ASA: II  Anesthesia Plan: General   Post-op Pain Management:    Induction: Intravenous  Airway Management Planned: Oral ETT  Additional Equipment:   Intra-op Plan:   Post-operative Plan: Extubation in OR  Informed Consent: I have reviewed the patients History and Physical, chart, labs and discussed the procedure including the risks, benefits and alternatives for the proposed anesthesia with the patient or authorized representative who has indicated his/her understanding and acceptance.     Plan Discussed with: CRNA and Surgeon  Anesthesia Plan Comments:         Anesthesia Quick Evaluation

## 2011-10-08 NOTE — Transfer of Care (Signed)
Immediate Anesthesia Transfer of Care Note  Patient: Alan Munoz  Procedure(s) Performed: Procedure(s) (LRB): ANTERIOR CERVICAL DECOMPRESSION/DISCECTOMY FUSION 3 LEVELS (Bilateral)  Patient Location: PACU  Anesthesia Type: General  Level of Consciousness: awake  Airway & Oxygen Therapy: Patient Spontanous Breathing and Patient connected to nasal cannula oxygen  Post-op Assessment: Report given to PACU RN and Post -op Vital signs reviewed and stable  Post vital signs: Reviewed and stable  Complications: No apparent anesthesia complications

## 2011-10-08 NOTE — Anesthesia Postprocedure Evaluation (Signed)
Anesthesia Post Note  Patient: Alan Munoz  Procedure(s) Performed: Procedure(s) (LRB): ANTERIOR CERVICAL DECOMPRESSION/DISCECTOMY FUSION 3 LEVELS (Bilateral)  Anesthesia type: General  Patient location: PACU  Post pain: Pain level controlled and Adequate analgesia  Post assessment: Post-op Vital signs reviewed, Patient's Cardiovascular Status Stable, Respiratory Function Stable, Patent Airway and Pain level controlled  Last Vitals:  Filed Vitals:   10/08/11 1545  BP: 161/62  Pulse: 80  Temp:   Resp: 13    Post vital signs: Reviewed and stable  Level of consciousness: awake, alert  and oriented  Complications: No apparent anesthesia complications

## 2011-10-08 NOTE — Progress Notes (Signed)
Orthopedic Tech Progress Note Patient Details:  Alan Munoz Jan 30, 1941 [id number]  Other Ortho Devices Type of Ortho Device: Philadelphia cervical collar Ortho Device Interventions: Casandra Doffing 10/08/2011, 5:50 PM

## 2011-10-09 ENCOUNTER — Encounter (HOSPITAL_COMMUNITY): Payer: Self-pay | Admitting: Orthopedic Surgery

## 2011-10-09 NOTE — Discharge Summary (Signed)
NAMEVICENT, Alan Munoz                ACCOUNT NO.:  [id number]  MEDICAL RECORD NO.:  [id number]  LOCATION:  3528                         FACILITY:  [facility]  PHYSICIAN:  Estill Bamberg, MD      DATE OF BIRTH:  1941/01/04  DATE OF ADMISSION:  10/08/2011 DATE OF DISCHARGE:  10/09/2011                              DISCHARGE SUMMARY   ADMISSION DIAGNOSIS:  Cervical myeloradiculopathy.  DISCHARGE DIAGNOSIS:  Cervical myeloradiculopathy.  ADMITTING PHYSICIAN:  Estill Bamberg, MD  ADMISSION HISTORY:  Briefly, [name] is a very pleasant 71 year old male who initially presented to me with signs and symptoms associated with myelopathy in addition to radiculopathy.  The patient did have an MRI notable for profound degenerative disk disease at C4-5, C5-6, and C6- 7 with both spinal cord compression and cervical nerve compression.  The patient was therefore admitted on Oct 08, 2011 for operative intervention.  HOSPITAL COURSE:  On Oct 08, 2011, the patient was admitted for a 3- level ACDF as reflected above.  The patient tolerated the procedure well and was evaluated by me both postoperatively and on the morning of postoperative day #1.  The patient was noted to be neurovascularly intact on the morning of postoperative day #1.  His throat was soft and nontender.  He was tolerating his Aspen cervical collar, and his pain was minimal.  He did report some intermittent tingling in his hands upon awakening, but this did improve on the morning of postoperative day #1. The patient was ultimately discharged home on the morning of postoperative day #1.  DISCHARGE INSTRUCTIONS:  The patient will take Percocet for pain and Valium for spasms.  He will be maintained in his Aspen cervical collar, and he was given a Philadelphia collar to be used for showering.  He will avoid lifting over 10 pounds.  He will follow up in my office in approximately 2 weeks after his surgery.     Estill Bamberg,  MD     MD/MEDQ  D:  10/09/2011  T:  10/09/2011  Job:  [id number]

## 2011-10-09 NOTE — Progress Notes (Signed)
Utilization review completed. Harlan Vinal, RN, BSN. 10/09/11  

## 2011-10-09 NOTE — Progress Notes (Signed)
Patient with throat discomfort and hand tingling similar to preop.  Doing well.  BP 107/58  Pulse 69  Temp(Src) 99 F (37.2 C) (Oral)  Resp 18  Ht 5\' 5"  (1.651 m)  Wt 78.79 kg (173 lb 11.2 oz)  BMI 28.91 kg/m2  SpO2 97%  Dressing CDI NVI  POD #1 after C4-7 acdf, doing well  - philly collar to bedside for showering - aspen collar at all times - d/c home today, f/u 2 weeks

## 2013-01-01 IMAGING — CR DG CHEST 2V
2 series · 2 of 2 positions shown · non-contrast
Comparison: None

CLINICAL DATA: Preop for neck surgery.

CHEST - 2 VIEW

[view not recorded (1 of 2)]
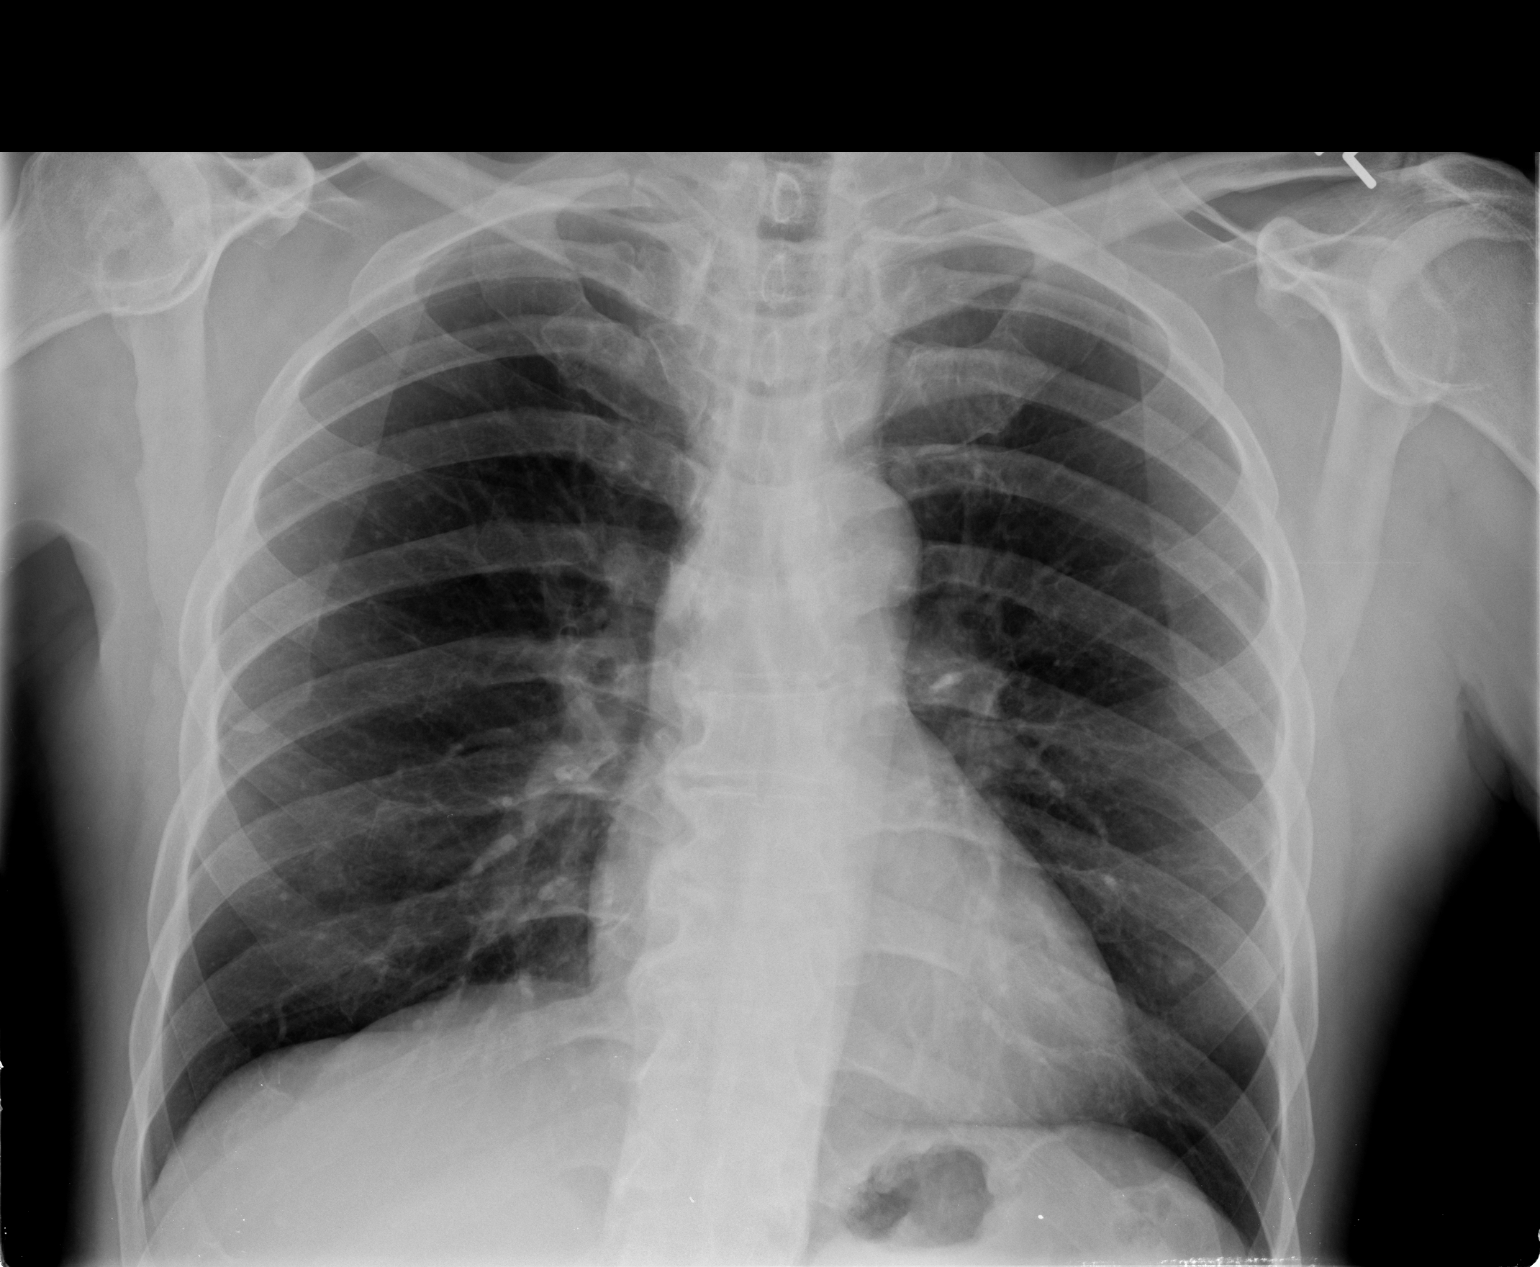

[view not recorded (2 of 2)]
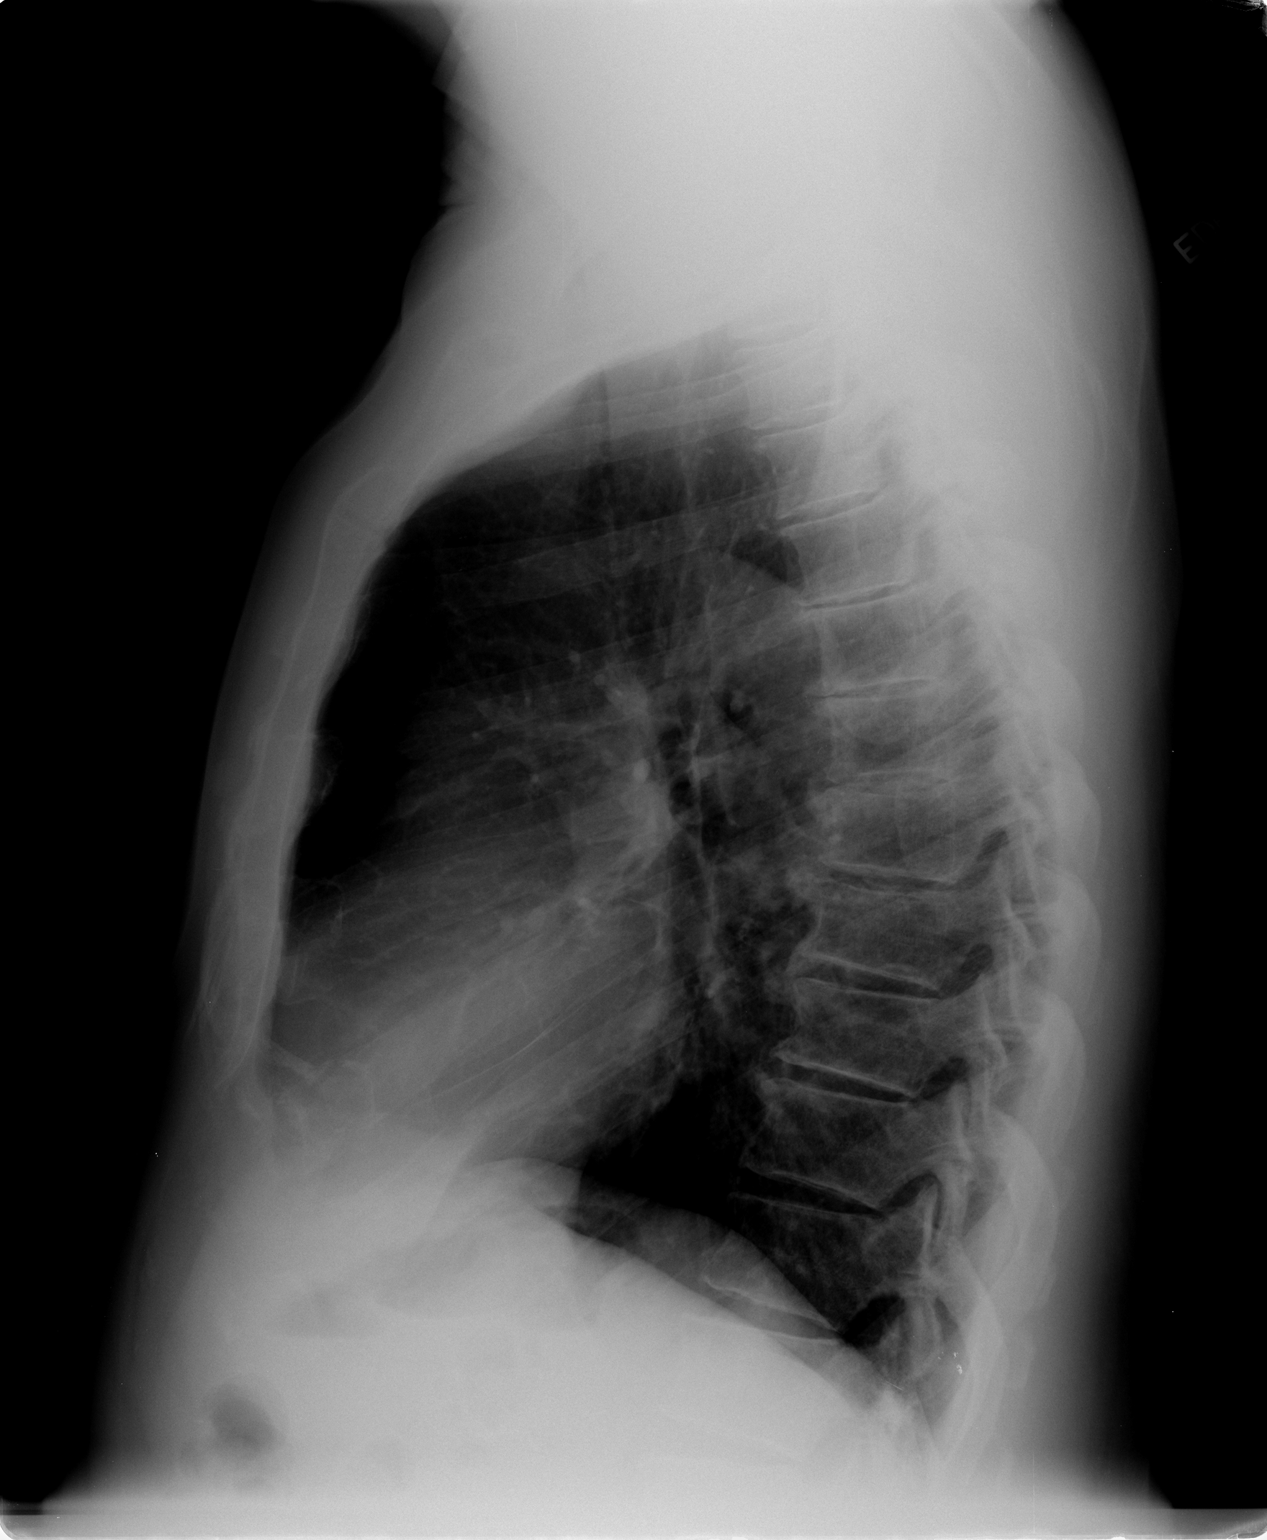

[2 of 2 positions shown; findings below may reference images not displayed]

FINDINGS: The cardiac silhouette, mediastinal and hilar contours
are within normal limits.  The lungs are clear.  No pleural
effusion.  Bilateral nipple shadows are noted.  The bony thorax is
intact.
IMPRESSION: No acute cardiopulmonary findings.

## 2019-07-10 ENCOUNTER — Ambulatory Visit: Payer: Medicare Other | Attending: Internal Medicine

## 2019-07-10 DIAGNOSIS — Z23 Encounter for immunization: Secondary | ICD-10-CM

## 2019-07-10 NOTE — Progress Notes (Signed)
   Covid-19 Vaccination Clinic  Name:  Alan Munoz    MRN: [id number] DOB: 24-Sep-1940  07/10/2019  [name] was observed post Covid-19 immunization for 30 minutes based on pre-vaccination screening without incidence. He was provided with Vaccine Information Sheet and instruction to access the V-Safe system.   [name] was instructed to call 911 with any severe reactions post vaccine: Marland Kitchen Difficulty breathing  . Swelling of your face and throat  . A fast heartbeat  . A bad rash all over your body  . Dizziness and weakness    Immunizations Administered    Name Date Dose VIS Date Route   Pfizer COVID-19 Vaccine 07/10/2019  2:02 PM 0.3 mL 04/29/2019 Intramuscular   Manufacturer: [employer], [employer]   Lot: [id number]   NDC: [address]-1

## 2019-08-02 ENCOUNTER — Ambulatory Visit: Payer: Medicare Other | Attending: Internal Medicine

## 2019-08-02 DIAGNOSIS — Z23 Encounter for immunization: Secondary | ICD-10-CM

## 2019-08-02 NOTE — Progress Notes (Signed)
   Covid-19 Vaccination Clinic  Name:  MARQUAL MI    MRN: [id number] DOB: 01-18-1941  08/02/2019  Mr. Stipes was observed post Covid-19 immunization for 15 minutes without incident. He was provided with Vaccine Information Sheet and instruction to access the V-Safe system.   Mr. Berkovich was instructed to call 911 with any severe reactions post vaccine: Marland Kitchen Difficulty breathing  . Swelling of face and throat  . A fast heartbeat  . A bad rash all over body  . Dizziness and weakness   Immunizations Administered    Name Date Dose VIS Date Route   Pfizer COVID-19 Vaccine 08/02/2019  2:08 PM 0.3 mL 04/29/2019 Intramuscular   Manufacturer: [employer], [employer]   Lot: [id number]   NDC: [id number]

## 2021-01-08 ENCOUNTER — Emergency Department (HOSPITAL_BASED_OUTPATIENT_CLINIC_OR_DEPARTMENT_OTHER): Payer: Medicare Other

## 2021-01-08 ENCOUNTER — Other Ambulatory Visit: Payer: Self-pay

## 2021-01-08 ENCOUNTER — Emergency Department (HOSPITAL_BASED_OUTPATIENT_CLINIC_OR_DEPARTMENT_OTHER)
Admission: EM | Admit: 2021-01-08 | Discharge: 2021-01-08 | Discharge status: Against Medical Advice | Payer: Medicare Other | Attending: Emergency Medicine | Admitting: Emergency Medicine

## 2021-01-08 ENCOUNTER — Encounter (HOSPITAL_BASED_OUTPATIENT_CLINIC_OR_DEPARTMENT_OTHER): Payer: Self-pay | Admitting: *Deleted

## 2021-01-08 DIAGNOSIS — I10 Essential (primary) hypertension: Secondary | ICD-10-CM | POA: Diagnosis not present

## 2021-01-08 DIAGNOSIS — R001 Bradycardia, unspecified: Secondary | ICD-10-CM | POA: Diagnosis present

## 2021-01-08 DIAGNOSIS — I493 Ventricular premature depolarization: Secondary | ICD-10-CM | POA: Diagnosis not present

## 2021-01-08 DIAGNOSIS — Z87891 Personal history of nicotine dependence: Secondary | ICD-10-CM | POA: Diagnosis not present

## 2021-01-08 DIAGNOSIS — Z79899 Other long term (current) drug therapy: Secondary | ICD-10-CM | POA: Insufficient documentation

## 2021-01-08 LAB — COMPREHENSIVE METABOLIC PANEL
ALT: 25 U/L (ref 0–44)
AST: 28 U/L (ref 15–41)
Albumin: 4.3 g/dL (ref 3.5–5.0)
Alkaline Phosphatase: 48 U/L (ref 38–126)
Anion gap: 8 (ref 5–15)
BUN: 24 mg/dL — ABNORMAL HIGH (ref 8–23)
CO2: 24 mmol/L (ref 22–32)
Calcium: 9.5 mg/dL (ref 8.9–10.3)
Chloride: 104 mmol/L (ref 98–111)
Creatinine, Ser: 0.88 mg/dL (ref 0.61–1.24)
GFR, Estimated: >60 mL/min (ref >60)
Glucose, Bld: 95 mg/dL (ref 70–99)
Potassium: 4.2 mmol/L (ref 3.5–5.1)
Sodium: 136 mmol/L (ref 135–145)
Total Bilirubin: 0.4 mg/dL (ref 0.3–1.2)
Total Protein: 6.8 g/dL (ref 6.5–8.1)

## 2021-01-08 LAB — CBC WITH DIFFERENTIAL/PLATELET
Abs Immature Granulocytes: 0.01 10*3/uL (ref 0.00–0.07)
Basophils Absolute: 0 10*3/uL (ref 0.0–0.1)
Basophils Relative: 1 %
Eosinophils Absolute: 0.1 10*3/uL (ref 0.0–0.5)
Eosinophils Relative: 2 %
HCT: 39.9 % (ref 39.0–52.0)
Hemoglobin: 13.7 g/dL (ref 13.0–17.0)
Immature Granulocytes: 0 %
Lymphocytes Relative: 33 %
Lymphs Abs: 2.2 10*3/uL (ref 0.7–4.0)
MCH: 32.9 pg (ref 26.0–34.0)
MCHC: 34.3 g/dL (ref 30.0–36.0)
MCV: 95.9 fL (ref 80.0–100.0)
Monocytes Absolute: 0.6 10*3/uL (ref 0.1–1.0)
Monocytes Relative: 9 %
Neutro Abs: 3.7 10*3/uL (ref 1.7–7.7)
Neutrophils Relative %: 55 %
Platelets: 174 10*3/uL (ref 150–400)
RBC: 4.16 MIL/uL — ABNORMAL LOW (ref 4.22–5.81)
RDW: 12.7 % (ref 11.5–15.5)
WBC: 6.7 10*3/uL (ref 4.0–10.5)
nRBC: 0 % (ref 0.0–0.2)

## 2021-01-08 LAB — TROPONIN I (HIGH SENSITIVITY): Troponin I (High Sensitivity): 7 ng/L (ref <18)

## 2021-01-08 NOTE — ED Provider Notes (Signed)
[facility] EMERGENCY DEPARTMENT Provider Note   CSN: [id number] Arrival date & time: 01/08/21  1524     History Chief Complaint  Patient presents with   Bradycardia    Alan Munoz is a 80 y.o. male.  Pt was sent here from his doctors office due to a low heart rate.  Pt reports his heart rate was to low to give plasma last yesterday.  Pt tried to go to a cardiologist but needed a referral.  Pt went to his MD's office and his heart rate was low.  Pt had a ZIO patch placed.  Pt advised to come in for evaluation.  Pt denies any complaints.  Pt has not had any chest pain.  No shortness of breath   The history is provided by the patient. No language interpreter was used.    I spoke to his MD's office.  Pt sent here because heart rate was between 40-60   Past Medical History:  Diagnosis Date   Heart murmur    Hypertension     There are no problems to display for this patient.   Past Surgical History:  Procedure Laterality Date   ANTERIOR CERVICAL DECOMP/DISCECTOMY FUSION  10/08/2011   Procedure: ANTERIOR CERVICAL DECOMPRESSION/DISCECTOMY FUSION 3 LEVELS;  Surgeon: Emilee Hero, MD;  Location: [facility] OR;  Service: Orthopedics;  Laterality: Bilateral;  Anterior cervival decompression fusion, cervical 4-5, cervical 5-6, cervical 6-7 with instrumentation and allograft   CARPAL TUNNEL RELEASE     12/2010 bilateral   GROIN DEBRIDEMENT     for MRSA with wound vac 12/2006   ROTATOR CUFF REPAIR     2008 right       No family history on file.  Social History   Tobacco Use   Smoking status: Former    Types: Pipe   Smokeless tobacco: Never  Substance Use Topics   Alcohol use: Never   Drug use: Never    Home Medications Prior to Admission medications   Medication Sig Start Date End Date Taking? Authorizing Provider  Ascorbic Acid (VITAMIN C) 1000 MG tablet Take 1,000 mg by mouth daily.    [provider]  calcium carbonate (OS-CAL) 600 MG TABS Take  600 mg by mouth 2 (two) times daily with a meal.    [provider]  cholecalciferol (VITAMIN D) 1000 UNITS tablet Take 1,000 Units by mouth daily.    [provider]  Coenzyme Q10 (CO Q 10) 10 MG CAPS Take 1 tablet by mouth daily.    [provider]  folic acid (FOLVITE) 800 MCG tablet Take 400 mcg by mouth 2 (two) times daily.    [provider]  Inositol Niacinate (NIACIN FLUSH FREE) 500 MG CAPS Take 1 capsule by mouth daily.    [provider]  IRON PO Take 1 tablet by mouth daily.    [provider]  losartan-hydrochlorothiazide (HYZAAR) 100-25 MG per tablet Take 1 tablet by mouth daily.    [provider]  Magnesium 250 MG TABS Take by mouth.    [provider]  Multiple Vitamin (MULITIVITAMIN WITH MINERALS) TABS Take 1 tablet by mouth daily.    [provider]  pravastatin (PRAVACHOL) 40 MG tablet Take 40 mg by mouth daily.    [provider]  pyridOXINE (VITAMIN B-6) 100 MG tablet Take 100 mg by mouth 2 (two) times daily.    [provider]  Red Yeast Rice 600 MG CAPS Take 1 capsule by mouth  2 (two) times daily.    [provider]  vitamin E 400 UNIT capsule Take 400 Units by mouth 2 (two) times daily.    [provider]    Allergies    Codeine  Review of Systems   Review of Systems  Respiratory:  Negative for chest tightness and shortness of breath.   Cardiovascular:  Negative for chest pain and leg swelling.  All other systems reviewed and are negative.  Physical Exam Updated Vital Signs BP (!) 151/85   Pulse (!) 54   Temp 98.3 F (36.8 C) (Oral) Comment: Simultaneous filing. User may not have seen previous data. Comment (Src): Simultaneous filing. User may not have seen previous data.  Resp 17   Ht 5' 4.5" (1.638 m)   Wt 72.6 kg   SpO2 98%   BMI 27.04 kg/m   Physical Exam Vitals and nursing note reviewed.  Constitutional:      Appearance: He is  well-developed.  HENT:     Head: Normocephalic and atraumatic.  Eyes:     Conjunctiva/sclera: Conjunctivae normal.  Cardiovascular:     Rate and Rhythm: Normal rate and regular rhythm.     Heart sounds: No murmur heard. Pulmonary:     Effort: Pulmonary effort is normal. No respiratory distress.     Breath sounds: Normal breath sounds.  Abdominal:     Palpations: Abdomen is soft.     Tenderness: There is no abdominal tenderness.  Musculoskeletal:     Cervical back: Neck supple.  Skin:    General: Skin is warm and dry.  Neurological:     General: No focal deficit present.     Mental Status: He is alert.  Psychiatric:        Mood and Affect: Mood normal.    ED Results / Procedures / Treatments   Labs (all labs ordered are listed, but only abnormal results are displayed) Labs Reviewed  CBC WITH DIFFERENTIAL/PLATELET - Abnormal; Notable for the following components:      Result Value   RBC 4.16 (*)    All other components within normal limits  COMPREHENSIVE METABOLIC PANEL - Abnormal; Notable for the following components:   BUN 24 (*)    All other components within normal limits  TROPONIN I (HIGH SENSITIVITY)    EKG EKG Interpretation  Date/Time:  Tuesday January 08 2021 15:36:55 EDT Ventricular Rate:  58 PR Interval:  255 QRS Duration: 173 QT Interval:  470 QTC Calculation: 462 R Axis:   -78 Text Interpretation: Sinus rhythm Prolonged PR interval RBBB and LAFB No significant change since last tracing Since last tracing of earlier today Confirmed by Alvira Monday ([id number]) on 01/08/2021 3:52:46 PM  Radiology DG Chest Port 1 View  Result Date: 01/08/2021 CLINICAL DATA:  Cardiac eval EXAM: PORTABLE CHEST 1 VIEW COMPARISON:  Chest x-ray dated December 18, 2017 FINDINGS: Cardiac and mediastinal contours are unchanged and within normal limits. Lungs are clear. No pleural effusion or pneumothorax. Electronic device overlying the left upper chest. IMPRESSION: No active disease.  Electronically Signed   By: Allegra Lai M.D.   On: 01/08/2021 16:40    Procedures Procedures   Medications Ordered in ED Medications - No data to display  ED Course  I have reviewed the triage vital signs and the nursing notes.  Pertinent labs & imaging results that were available during my care of the patient were reviewed by me and considered in my medical decision making (see chart for details).  MDM Rules/Calculators/A&P                           MDM;  Dr. Dalene Seltzer in to see and examine.  Pt decided he did not want further evaluation.  Pt has on zio patch  Final Clinical Impression(s) / ED Diagnoses Final diagnoses:  Bradycardia  PVC (premature ventricular contraction)    Rx / DC Orders ED Discharge Orders     None     ama   Osie Cheeks 01/08/21 1913    Alvira Monday, MD 01/09/21 2241

## 2021-01-08 NOTE — ED Triage Notes (Signed)
He was seen by his MD today and had an EKG that showed a slow heart rate. EKG repeated at bedside here. No symptoms.

## 2022-04-06 IMAGING — DX DG CHEST 1V PORT
1 series · 1 of 1 positions shown · non-contrast
Comparison: Chest x-ray dated December 18, 2017

CLINICAL DATA: Cardiac eval

EXAM:
PORTABLE CHEST 1 VIEW

[chest ap]
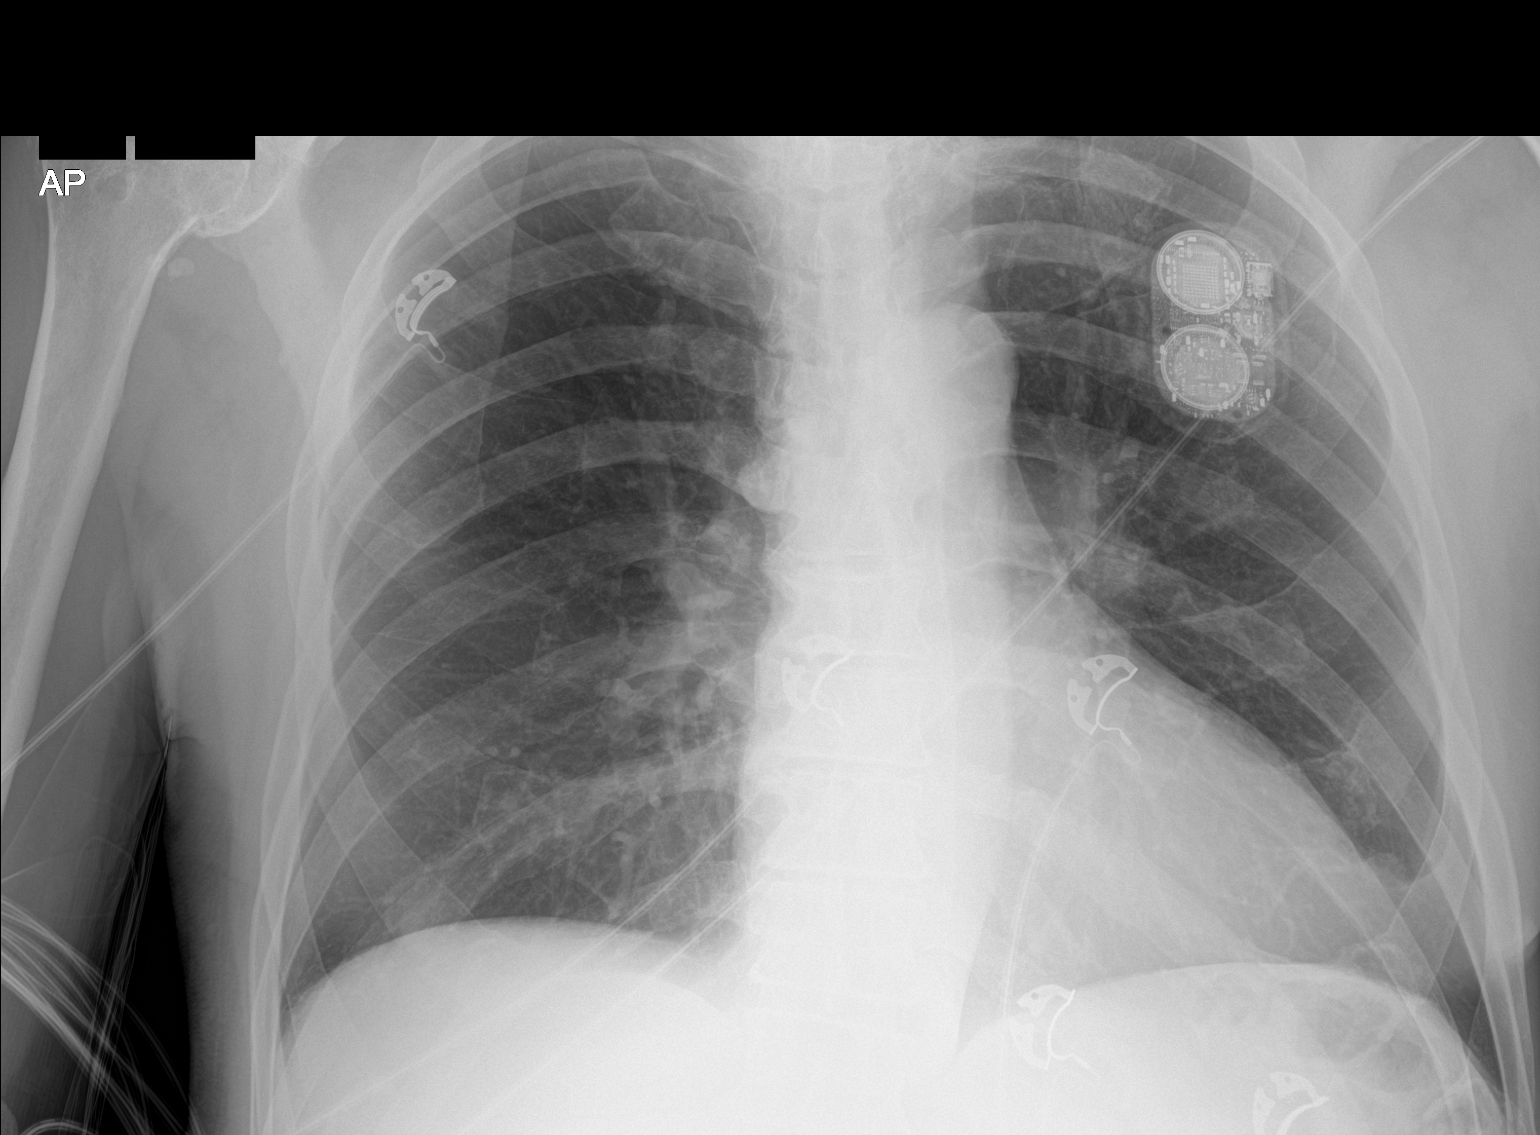

[1 of 1 positions shown; findings below may reference images not displayed]

FINDINGS: Cardiac and mediastinal contours are unchanged and within normal
limits. Lungs are clear. No pleural effusion or pneumothorax.
Electronic device overlying the left upper chest.
IMPRESSION: No active disease.

## 2023-08-11 ENCOUNTER — Encounter: Payer: Self-pay | Admitting: Gastroenterology

## 2023-10-05 ENCOUNTER — Ambulatory Visit: Admitting: Gastroenterology

## 2023-10-05 ENCOUNTER — Encounter: Payer: Self-pay | Admitting: Gastroenterology

## 2023-10-05 VITALS — BP 130/60 | HR 61 | Ht 64.0 in | Wt 158.0 lb

## 2023-10-05 DIAGNOSIS — R1319 Other dysphagia: Secondary | ICD-10-CM

## 2023-10-05 DIAGNOSIS — R1310 Dysphagia, unspecified: Secondary | ICD-10-CM

## 2023-10-05 NOTE — Patient Instructions (Signed)
 You have been scheduled for a Barium Esophogram at [facility] Radiology (1st floor of the hospital) on 10/28/23 at 11:00am. Please arrive 30 minutes prior to your appointment for registration. Make certain not to have anything to eat or drink 3 hours prior to your test. If you need to reschedule for any reason, please contact radiology at [phone] to do so. __________________________________________________________________ A barium swallow is an examination that concentrates on views of the esophagus. This tends to be a double contrast exam (barium and two liquids which, when combined, create a gas to distend the wall of the oesophagus) or single contrast (non-ionic iodine  based). The study is usually tailored to your symptoms so a good history is essential. Attention is paid during the study to the form, structure and configuration of the esophagus, looking for functional disorders (such as aspiration, dysphagia, achalasia, motility and reflux) EXAMINATION You may be asked to change into a gown, depending on the type of swallow being performed. A radiologist and radiographer will perform the procedure. The radiologist will advise you of the type of contrast selected for your procedure and direct you during the exam. You will be asked to stand, sit or lie in several different positions and to hold a small amount of fluid in your mouth before being asked to swallow while the imaging is performed .In some instances you may be asked to swallow barium coated marshmallows to assess the motility of a solid food bolus. The exam can be recorded as a digital or video fluoroscopy procedure. POST PROCEDURE It will take 1-2 days for the barium to pass through your system. To facilitate this, it is important, unless otherwise directed, to increase your fluids for the next 24-48hrs and to resume your normal diet.  This test typically takes about 30 minutes to  perform. __________________________________________________________________________________  Thank you for trusting me with your gastrointestinal care. Deanna May, RNP

## 2023-10-05 NOTE — Progress Notes (Signed)
 Chief Complaint:dysphagia Primary GI Doctor:Dr. Karene Oto  HPI:  Patient is a  83 year old male patient with past medical history of hypertension, BPH, GERD with dysphagia, who was referred to me by Myrl Askew, DO on 08/17/23 for a complaint of dysphagia .    Pending neurology evaluation for dysphagia, unsteady gait and spondylosis with radiculopathy.   Interval History    Patient presents for main complaint of esophageal dysphagia with solids only. He states he has had two EGD's with dilatation in the past for same issue. He states the last dilatation in 2022 didn't seem to help. He avoids certain foods such as breads.  Patient currently not on antiacid medication. He reports he does not have GERD.  Patient denies nausea, vomiting, or weight loss. He reports he has bene watching his diet and cutting out certain foods.  Patient denies altered bowel habits, abdominal pain, or rectal bleeding.  No blood thinners.    Former smoker, stopped 45 years ago. No alcohol use  Patient's family history includes brother with brain CA.  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  Wt Readings from Last 3 Encounters:  10/05/23 158 lb (71.7 kg)  01/08/21 160 lb (72.6 kg)  10/06/11 173 lb 11.2 oz (78.8 kg)    Past Medical History:  Diagnosis Date   Arthritis    Gastric ulcer    Heart murmur    Hyperlipidemia    Hypertension    MRSA (methicillin resistant Staphylococcus aureus) infection    Past Surgical History:  Procedure Laterality Date   ANTERIOR CERVICAL DECOMP/DISCECTOMY FUSION  10/08/2011   Procedure: ANTERIOR CERVICAL DECOMPRESSION/DISCECTOMY FUSION 3 LEVELS;  Surgeon: Estevan Helper, MD;  Location: [facility] OR;  Service: Orthopedics;  Laterality: Bilateral;  Anterior cervival decompression fusion, cervical 4-5, cervical 5-6, cervical 6-7 with instrumentation and allograft   CARPAL TUNNEL RELEASE     12/2010 bilateral   GROIN DEBRIDEMENT     for MRSA with wound vac  12/2006   ROTATOR CUFF REPAIR     2008 right   Current Outpatient Medications  Medication Sig Dispense Refill   acetaminophen  (TYLENOL ) 500 MG tablet Take 500 mg by mouth every 4 (four) hours as needed.     amLODipine (NORVASC) 5 MG tablet Take 5 mg by mouth daily.     Ascorbic Acid  (VITAMIN C ) 1000 MG tablet Take 1,000 mg by mouth daily.     Calcium  Magnesium Zinc 333-133-5 MG TABS Take 1 tablet by mouth daily.     cholecalciferol  (VITAMIN D) 1000 UNITS tablet Take 1,000 Units by mouth daily.     gabapentin (NEURONTIN) 300 MG capsule Take 600 mg by mouth 2 (two) times daily.     IRON PO Take 1 tablet by mouth daily.     Multiple Vitamin (MULITIVITAMIN WITH MINERALS) TABS Take 1 tablet by mouth daily.     pravastatin (PRAVACHOL) 40 MG tablet Take 40 mg by mouth daily.     pyridOXINE  (VITAMIN B-6) 100 MG tablet Take 100 mg by mouth 2 (two) times daily.     No current facility-administered medications for this visit.   Allergies as of 10/05/2023 - Review Complete 10/05/2023  Allergen Reaction Noted   Codeine Nausea And Vomiting 09/23/2011   History reviewed. No pertinent family history.  Review of Systems:    Constitutional: No weight loss, fever, chills, weakness or fatigue HEENT: Eyes: No change in vision  Ears, Nose, Throat:  No change in hearing or congestion Skin: No rash or itching Cardiovascular: No chest pain, chest pressure or palpitations   Respiratory: No SOB or cough Gastrointestinal: See HPI and otherwise negative Genitourinary: No dysuria or change in urinary frequency Neurological: No headache, dizziness or syncope Musculoskeletal: No new muscle or joint pain Hematologic: No bleeding or bruising Psychiatric: No history of depression or anxiety    Physical Exam:  Vital signs: BP 130/60   Pulse 61   Ht 5\' 4"  (1.626 m)   Wt 158 lb (71.7 kg)   BMI 27.12 kg/m   Constitutional:   Pleasant male appears to be in NAD, Well developed, Well nourished,  alert and cooperative Throat: Oral cavity and pharynx without inflammation, swelling or lesion.  Respiratory: Respirations even and unlabored. Lungs clear to auscultation bilaterally.   No wheezes, crackles, or rhonchi.  Cardiovascular: Normal S1, S2. Regular rate and rhythm. No peripheral edema, cyanosis or pallor.  Gastrointestinal:  Soft, nondistended, nontender. No rebound or guarding. Normal bowel sounds. No appreciable masses or hepatomegaly. Rectal:  Not performed.  Msk:  Symmetrical without gross deformities. Without edema, no deformity or joint abnormality.  Neurologic:  Alert and  oriented x4;  grossly normal neurologically.  Skin:   Dry and intact without significant lesions or rashes. Psychiatric: Oriented to person, place and time. Demonstrates good judgement and reason without abnormal affect or behaviors.  RELEVANT LABS AND IMAGING: CBC    Latest Ref Rng & Units 01/08/2021    3:38 PM 10/06/2011   11:09 AM  CBC  WBC 4.0 - 10.5 K/uL 6.7  9.1   Hemoglobin 13.0 - 17.0 g/dL 24.4  01.0   Hematocrit 39.0 - 52.0 % 39.9  43.5   Platelets 150 - 400 K/uL 174  224      CMP     Latest Ref Rng & Units 01/08/2021    3:38 PM 10/06/2011   11:09 AM  CMP  Glucose 70 - 99 mg/dL 95  87   BUN 8 - 23 mg/dL 24  22   Creatinine 2.72 - 1.24 mg/dL 5.36  6.44   Sodium 034 - 145 mmol/L 136  133   Potassium 3.5 - 5.1 mmol/L 4.2  4.3   Chloride 98 - 111 mmol/L 104  97   CO2 22 - 32 mmol/L 24  24   Calcium  8.9 - 10.3 mg/dL 9.5  74.2   Total Protein 6.5 - 8.1 g/dL 6.8  7.3   Total Bilirubin 0.3 - 1.2 mg/dL 0.4  0.6   Alkaline Phos 38 - 126 U/L 48  58   AST 15 - 41 U/L 28  33   ALT 0 - 44 U/L 25  28   08/18/23 labs show: hgb 13.3, wbc 8.7, plt 202, BUN 15, Creat 0.83 05/01/21 EGD at [facility] 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 Biopsies were  obtained from the stricture.Dilation was done with a 17mm Savary Dilator  over a guidewire with minimal resistance.The wire was  positioned in the  gastric antrum and the scope withdrawn maintaining the wire in place. The  dilator was then introduced over the guidewire slowly with minimal  resistance. The dilator was then withdrawn and the scope re-introduced  into the distal esophagus which showed no complications.  The gastric mucosa appeared normal.  The first and second part of the duodenum appeared normal.  03/06/21 ESOPHOGRAM / BARIUM SWALLOW / BARIUM TABLET STUDY  IMPRESSION:  1. Samuel Crock  tracheal aspiration of barium which did not initially  elicit a cough response. Correlate clinically in determining whether  the patient would benefit from speech pathology assessment.  2. Mild distal esophageal longitudinal fold accentuation which can  be seen in setting of esophagitis. No visible ulcer or stricture.  3. No disruption of primary peristaltic waves, although there was  some distal secondary and mild tertiary contraction noted.  4. The 13 mm barium tablet passed briskly into the stomach, although  the patient declared a globus sensation after swallowing which  persisted for about 60 seconds through several additional swallows  of water.  02/26/21 TTE- LV ejection fraction = 55-60%.   Assessment: Encounter Diagnosis  Name Primary?   Esophageal dysphagia Yes     83 year old male patient that presents with esophageal dysphagia he has had for quite some time.  Patient states he had a EGD with dilatation back in 22 without improvement in symptoms which I suspect Elbony Mcclimans be related to a motility problem such as presbyesophagus?  He also is undergoing a neurology evaluation for unsteady gait and spondylosis with radiculopathy. I will order Barium esophogram with tablet to rule out neurologic or motility disorder. If negative will proceed with upper GI endoscopy with possible dilatation in [facility] with Dr. Karene Oto.  Denies GERD symptoms.  Plan: - Schedule Barium esophogram with tablet  -If negative exam, will proceed with  EGD with possible dilatation  Thank you for the courtesy of this consult. Please call me with any questions or concerns.   Elvis Boot, FNP-C [facility] 10/05/2023, 4:29 PM  Cc: Myrl Askew, DO

## 2023-10-06 ENCOUNTER — Encounter: Payer: Self-pay | Admitting: Neurology

## 2023-10-08 NOTE — Progress Notes (Signed)
 Agree with the assessment and plan as outlined by [facility], FNP-C. Interesting that his esophagram in 2022 showed barium tablet passed briskly into the stomach but patient reported globus sensation after swallowing.  Agree with starting with barium esophagram with tablet now with plan for upper endoscopy with dilation pending results.  Alan Nemmers, DO, [facility]

## 2023-10-28 ENCOUNTER — Ambulatory Visit (HOSPITAL_COMMUNITY)
Admission: RE | Admit: 2023-10-28 | Discharge: 2023-10-28 | Disposition: A | Discharge status: Home / Self Care | Source: Ambulatory Visit | Attending: Gastroenterology | Admitting: Gastroenterology

## 2023-10-28 DIAGNOSIS — R1319 Other dysphagia: Secondary | ICD-10-CM

## 2023-11-18 ENCOUNTER — Ambulatory Visit (HOSPITAL_COMMUNITY)
Admission: RE | Admit: 2023-11-18 | Discharge: 2023-11-18 | Disposition: A | Discharge status: Home / Self Care | Source: Ambulatory Visit | Attending: Gastroenterology | Admitting: Gastroenterology

## 2023-11-18 DIAGNOSIS — R1319 Other dysphagia: Secondary | ICD-10-CM | POA: Diagnosis present

## 2023-11-23 ENCOUNTER — Telehealth: Payer: Self-pay | Admitting: Gastroenterology

## 2023-11-23 DIAGNOSIS — K2289 Other specified disease of esophagus: Secondary | ICD-10-CM

## 2023-11-23 DIAGNOSIS — K224 Dyskinesia of esophagus: Secondary | ICD-10-CM

## 2023-11-23 MED ORDER — HYOSCYAMINE SULFATE 0.125 MG SL SUBL: 0.1250 mg | SUBLINGUAL_TABLET | Freq: Three times a day (TID) | SUBLINGUAL | 0 refills | Status: AC

## 2023-11-23 NOTE — Telephone Encounter (Signed)
 Spoke with patient about swallow study results.  Recommendations were to do speech therapy and trial hyoscyamine  before meals up to 3 times per day.  Patient would like to proceed with medication but would like to hold off on speech therapy for now.  Will send prescription.

## 2023-12-02 ENCOUNTER — Ambulatory Visit: Admitting: Neurology
